# Patient Record
Sex: Male | Born: 1985 | Race: White | Hispanic: No | Marital: Single
Health system: Midwestern US, Academic
[De-identification: ages and names within clinical notes are randomized; demographics above are authoritative.]

## PROBLEM LIST (undated history)

## (undated) DIAGNOSIS — F32A Depression, unspecified: Secondary | ICD-10-CM

## (undated) DIAGNOSIS — F329 Major depressive disorder, single episode, unspecified: Secondary | ICD-10-CM

---

## 2015-01-09 ENCOUNTER — Emergency Department
Admission: EM | Admit: 2015-01-09 | Discharge: 2015-01-10 | Disposition: A | Discharge status: Home / Self Care | Payer: Medicaid Other | Attending: Emergency Medicine | Admitting: Emergency Medicine

## 2015-01-09 ENCOUNTER — Emergency Department (EMERGENCY_DEPARTMENT_HOSPITAL): Payer: Medicaid Other

## 2015-01-09 DIAGNOSIS — S81012A Laceration without foreign body, left knee, initial encounter: Principal | ICD-10-CM | POA: Insufficient documentation

## 2015-01-09 DIAGNOSIS — W311XXA Contact with metalworking machines, initial encounter: Secondary | ICD-10-CM | POA: Insufficient documentation

## 2015-01-09 DIAGNOSIS — Y929 Unspecified place or not applicable: Secondary | ICD-10-CM | POA: Insufficient documentation

## 2015-01-09 DIAGNOSIS — S81012S Laceration without foreign body, left knee, sequela: Secondary | ICD-10-CM | POA: Insufficient documentation

## 2015-01-09 HISTORY — DX: Depression, unspecified: F32.A

## 2015-01-09 HISTORY — DX: Major depressive disorder, single episode, unspecified: F32.9

## 2015-01-09 NOTE — ED Triage Note (Signed)
Patient alert 2 wks ago was using a grinder and accidentally cut his lt knee.   A friend sutured it and gave him antibiotics. Pt states wound opened up now draining states has been packing the wound states not getting better.

## 2015-01-09 NOTE — ED Nursing Note (Signed)
Pt denies fever, wound above left knee is swollen and red.  Pt refused to remove the dressings at this time.  Pt ambulatory with steady gait.

## 2015-01-09 NOTE — ED Initial Note (Signed)
EMERGENCY DEPARTMENT PHYSICIAN NOTE - Shane Hendrix Ciaravino       Date of Service:   01/09/2015  9:21 PM Patient's PCP: No Pcp No Pcp   Note Started: 01/09/2015 22:24 DOB: November 25, 1985             Chief Complaint   Patient presents with    Wound Contaminated       The history provided by the patient.  Interpreter used: No    Shane Hendrix Ebrahimi is a 29yr old male, with a past medical history significant for depression, who presents to the ED with a chief complaint of left knee wound that began 2 weeks ago. Patient sustained wound from angle grinder when it sliced into his left knee. He had the wound sutured and was given Clindamycin, which he took for the next 4-5 days until suture was removed. The next day, patient packed the wound when his left knee started swelling and becoming more painful, making it more difficult to walk. About 3 days ago, patient noticed some drainage from the wound, so began taking Clindamycin again. Denies any gait problems or fevers.   Believes his last tetanus was within last 10 years. Presents today because of poor healing.     Quality: Aching  Location: left knee  Severity: 1/10  Time Course: constant  Progression: unchanged  Duration: 2 weeks  Palliative factors: nothing makes it better.  Provocative factors: nothing makes it worse.  Associated symptoms: Gait problem (now resolved), wound  Pertinent negatives: No current gait problems, no numbness or fevers.     A full history, including pertinent past medical and social history was reviewed.    HISTORY:  1    *Knee laceration, left, sequela     No Known Allergies   Past Medical History:    Depression                                                 History reviewed. No pertinent surgical history.   Social History    Marital status: SINGLE              Spouse name:                       Years of education:                 Number of children:               Occupational History    None on file    Social History Main Topics    Smoking status:  Current Every Day Smoker                                                     Packs/day: 0.00      Years: 0.00           Smokeless status: Not on file                       Alcohol use: No              Drug use: No  Sexual activity: Not on file          Other Topics            Concern    None on file    Social History Narrative    None on file     No family history on file.           Review of Systems   Constitutional: Negative for fever.   Musculoskeletal: Negative for gait problem.   Skin: Positive for wound.       TRIAGE VITAL SIGNS:  Temp: 36.5 C (97.7 F) (01/09/15 1501)  Temp src: Oral (01/09/15 1501)  Pulse: 92 (01/09/15 1501)  BP: 150/87 (01/09/15 1501)  Resp: 16 (01/09/15 1501)  SpO2: 100 % (01/09/15 1501)  Weight: 97.7 kg (215 lb 6.2 oz) (01/09/15 1507)    Physical Exam   Constitutional: He is oriented to person, place, and time. He appears well-developed and well-nourished. No distress.   HENT:   Head: Normocephalic and atraumatic.   Mouth/Throat: Oropharynx is clear and moist. No oropharyngeal exudate.   Eyes: Conjunctivae and EOM are normal. Pupils are equal, round, and reactive to light.   Neck: Normal range of motion. Neck supple.   Cardiovascular: Normal rate, regular rhythm and normal heart sounds.  Exam reveals no gallop and no friction rub.    No murmur heard.  Pulmonary/Chest: Effort normal and breath sounds normal. No respiratory distress. He has no wheezes. He has no rales.   Abdominal: Soft. He exhibits no distension. There is no tenderness. There is no rebound and no guarding.   Musculoskeletal: Normal range of motion. He exhibits tenderness.   LLE: 1.5-2 cm open wound to superior lateral aspect of left knee, extending to muscle belly. No purulence, effusion, erythema, joint line tenderness, or pain with ROM.    Neurological: He is alert and oriented to person, place, and time. No cranial nerve deficit.   Skin: Skin is warm and dry. No rash noted. He is not diaphoretic.    Psychiatric: He has a normal mood and affect. His behavior is normal.   Nursing note and vitals reviewed.      INITIAL ASSESSMENT & PLAN, MEDICAL DECISION MAKING, ED COURSE  Humberto Addo is a 29yr male who presents with a chief complaint of left knee wound.     Differential includes, but is not limited to: non-healing wound, joint capsule injury, foreign body, cellulitis, abscess.    The results of the ED evaluation were notable for the following:    Pertinent imaging results (reviewed and interpreted independently by me):   Radiology reads:   KNEE 4+ VIEWS, LEFT  Soft tissue swelling without acute fracture or dislocation.  No intra-articular air.      Patient Summary: 29 y/o male with nonhealing wound to left knee after injury about 2 weeks ago. Wound was sutured by friend and removed after about 7 days without incident but subsequently developed dehiscence of wound with discharge. He has been packing wound but presents due to poor healing. No joint pain or effusion to suggest septic arthritis.  No purulent discharge from wound and erythema localized to wound edges and no evidence of cellulitis. Given poor healing and location of wound, discussed with ortho, who recommends continuation of packing but does not believe saline arthrogram or delayed secondary closure is indicated. Patient discharged after wound re-packed and dressed.  Follow up and return precautions discussed.       LAST VITAL SIGNS:  Temp: 36.7  C (98.1 F) (01/10/15 0429)  Temp src: Oral (01/10/15 0429)  Pulse: 58 (01/10/15 0429)  BP: (!) 157/95 (01/10/15 0429)  Resp: 16 (01/10/15 0429)  SpO2: 100 % (01/10/15 0429)  Weight: 97.7 kg (215 lb 6.2 oz) (01/09/15 1507)      Clinical Impression: Left knee laceration, sequela       Disposition: Discharge. Follow up with PCP. ED discharge instructions were reviewed and provided. It was discussed that radiology reports are preliminary and the patient will be contacted for any changes in  interpretation.    PATIENT'S GENERAL CONDITION:  Good: Vital signs are stable and within normal limits. Patient is conscious and comfortable. Indicators are excellent.     SCRIBE STATEMENT  I, Suzie Portela, SCRIBE,  am personally taking down the notes in the presence of Dr. Melody Haver, MD.  Electronically signed by - Suzie Portela, SCRIBE, Scribe  01/09/2015  22:24      SCRIBE DISCLAIMER   I, Elenor Legato, personally performed the services described in this documentation, as scribed by the trained medical scribe above in my presence, and it is both accurate and complete.     Electronically signed by: Elenor Legato, Resident      This patient was seen, evaluated, and care plan was developed with the resident.  I agree with the findings and plan as outlined in our combined note. I personally independently visualized the images and tracings as noted above.      Sharene Butters, MD      Electronically signed by: Sharene Butters, MD, Attending Physician

## 2015-01-09 NOTE — ED Progress Note (Signed)
Emergency Department Triage Note     Subjective: Shane Hendrix is a [age]yr old male who presents to the ED with a chief complaint of knee wound.      Objective:    BP 150/87  Pulse 92  Temp 36.5 C (97.7 F) (Oral)  Resp 16  Ht 1.84 m (6' 0.44")  Wt 97.7 kg (215 lb 6.2 oz)  SpO2 100%  BMI 28.86 kg/m2     General: No acute distress.  Lungs: Breathing comfortably  Heart: normal rate      Assessment: Shane CastleDaniel Kovacevic is a 29yr old male with knee wound.      Plan: Further workup will proceed in b/c/d/e or f pod.    Seen and initially evaluated by:  Charlett NoseJulianne Marie Charrisse Masley, MD, Attending Physician, Department of Emergency Medicine       This patient was seen by a physician in triage. Please see the ED Initial Note for full details of this patient's care. This note covers the brief initial evaluation performed to obtain initial studies to expedite the patient's care. It is not intended to be a comprehensive document of the patient's ED visit.    This patient was instructed that this preliminary assessment and any studies obtained do not constitute a full workup of the patient's chief complaint. The patient was instructed to wait in the assigned area after the triage evaluation to be reevaluated by a physician after initial studies are completed.

## 2015-01-10 ENCOUNTER — Encounter: Payer: Self-pay | Admitting: Emergency Medicine

## 2015-01-10 DIAGNOSIS — S81012S Laceration without foreign body, left knee, sequela: Secondary | ICD-10-CM | POA: Insufficient documentation

## 2015-01-10 NOTE — Discharge Instructions (Signed)
Thank you for choosing [facility] for your emergency health care needs. It has been our privilege to take care of you today. Your primary complaints have been evaluated, conditions requiring emergent intervention have been deemed unlikely, and you have been treated for your symptoms.  At this time it is felt to be safe for you to return home.  Please take all medicines that are prescribed to you as directed (see below).  If you have a primary care physician, it is crucial that you follow up with him or her in the time frame recommended as many health conditions that seem self-limited initially may actually worsen over time.  If you do not have a primary care physician, we will outline the various resources available for you to find one.    - Please follow up with your primary care doctor in 2 days.  - It is very important to take all prescriptions as directed.  - For pain, take tylenol or ibuprofen     -  Return to the emergency department immediately if you experience any fevers greater than 100.4 Farenheit, difficulty breathing, worsening pain, nausea, vomiting, or diarrhea that does not go away, or have any other concerning symptoms.       If at any time you feel that your condition is worsening, call your doctor or return to the emergency department for reevaluation.    Please realize that the results of some studies that you had done during your stay with us (such as xrays and cultures) are only preliminarily resulted.  Results of these studies may change as more information becomes available or as the studies are reevaluated by other members of our health care team in the next few days. We will attempt to contact you with any important changes or additions to the studies that were obtained today.  Additionally, some of the studies done during today's visit may not have results available for a few days.  If you were told that a lab was drawn and that results would be available at a future date you  may call [phone] for these results, please wait 3 days before calling.      [facility]    [facility]  [address], [place]      [phone] (will fill [facility] after hours)  [address], [place]   [phone]    [facility]  [address]       [phone]  (until 11pm)

## 2015-01-10 NOTE — ED Nursing Note (Signed)
Patient discharged to home.  Discharge instructions provided; Patient expressed understanding.  No other needs or concerns expressed.  Patient ambulated to discharge area with even and steady gait.

## 2015-01-10 NOTE — ED Nursing Note (Signed)
No distress noted.  Updated Patient with plan of care; Patient expressed understanding.  Patient waiting Ortho consult.

## 2015-01-10 NOTE — ED Nursing Note (Signed)
Resident at bedside to pack wound.

## 2015-01-10 NOTE — ED Nursing Note (Signed)
Ortho at bedside for assessment

## 2016-07-01 ENCOUNTER — Encounter: Admit: 2016-07-01 | Discharge: 2016-07-01 | Payer: BC Managed Care – PPO

## 2016-07-01 ENCOUNTER — Ambulatory Visit: Admit: 2016-07-01 | Discharge: 2016-07-01 | Payer: BC Managed Care – PPO

## 2016-07-01 DIAGNOSIS — T148XXA Other injury of unspecified body region, initial encounter: Principal | ICD-10-CM

## 2016-07-01 DIAGNOSIS — S02609G Fracture of mandible, unspecified, subsequent encounter for fracture with delayed healing: Principal | ICD-10-CM

## 2016-07-01 NOTE — Progress Notes
Date of Service: 07/01/2016    Subjective:             Anthony Bean is a 31 y.o. male.    History of Present Illness  Here for wound in the inner right lower mucosal area s/p ORIF mandible 10/12/2015.  Pt was doing well until a few months ago when he noticed a wound in his mouth.  No drainage and had been following with a dentist, endodontist and then was referred to a facial surgeon Dr. Burgess Estelle.  He returns for out opinion regarding the need to remove the plate and screws.  He denies any new pain,swelling, redness, drainage, foul taste in mouth, fever or chills or feeling ill.     Pt is a smoker 1 ppd and discussed the risks of smoking and wound healing with him.   Pt also has a hx of drug and alcohol addiction and we would be strict on his pain medications while here.     Family and social hx reviewed and noncontributory unless noted above.     Review of Systems   Constitutional: Negative.    HENT: Negative.    Eyes: Negative.    Respiratory: Negative.    Cardiovascular: Negative.    Gastrointestinal: Negative.    Endocrine: Negative.    Genitourinary: Negative.    Musculoskeletal: Negative.    Skin: Negative.    Allergic/Immunologic: Negative.    Neurological: Negative.    Hematological: Negative.    Psychiatric/Behavioral: Negative.          Objective:         ??? GABAPENTIN PO Take  by mouth. Indications: 1200 mg q8h prn   ??? oxycodone(+) (ROXICODONE, OXY-IR) 10 mg tablet Take 1 tablet by mouth every 4 hours as needed Earliest Fill Date: 10/29/15 (Patient taking differently: Take by mouth every 4 hours as needed Indications: 4mg  q8h prn )   ??? venlafaxine XR (EFFEXOR XR) 150 mg capsule Take 1 capsule by mouth daily. Take with food.     Vitals:    07/01/16 1326   BP: (!) 151/93   Pulse: 89   Weight: 90.7 kg (200 lb)   Height: 180.3 cm (71)     Body mass index is 27.89 kg/m???.     Physical Exam   Constitutional: He is oriented to person, place, and time. He appears well-developed and well-nourished.   HENT: Head: Normocephalic and atraumatic.   Eyes: Pupils are equal, round, and reactive to light.   Neck: Normal range of motion.   Cardiovascular: Normal rate.    Pulmonary/Chest: Effort normal.   Abdominal: Soft.   Musculoskeletal: Normal range of motion.   Neurological: He is alert and oriented to person, place, and time.   Skin: Skin is warm and dry.   Psychiatric: He has a normal mood and affect. His behavior is normal. Thought content normal.     Occulusion remains intact  Right lower inner mucosa with open wound at the right lower gum line anterior to tooth #31.  Tooth 30 has been excised.  No drainage or inflamed swelling of the gum.  Exposed bone but no visible exposed plate present.  No palpable fluid collection.   Vital signs reviewed         Assessment and Plan:  31 yo male s/p ORIF right mandible on 10/12/15 now with exposed bone  - Discussed options of observation and oral hygiene and seeing if bone heals vs surgical excision of upper plate and closure.  Risks discussed with be infection, bleeding, delayed wound healing and need for additional procedures and pt did opt for surgery.  - Plan for the OR on 6/07 for debridement and removal of plate with Dr. Caralee Ates    - Advised to refrain from smoking    Pt did bring in imaging panorex and 3d imaging that will be reviewed by Dr. Caralee Ates.  He will also try and obtain his CT scan if possible.

## 2016-07-02 ENCOUNTER — Encounter: Admit: 2016-07-02 | Discharge: 2016-07-02 | Payer: BC Managed Care – PPO

## 2016-07-02 DIAGNOSIS — T148XXA Other injury of unspecified body region, initial encounter: Principal | ICD-10-CM

## 2016-07-02 DIAGNOSIS — S01502D Unspecified open wound of oral cavity, subsequent encounter: Principal | ICD-10-CM

## 2016-07-02 DIAGNOSIS — R69 Illness, unspecified: Principal | ICD-10-CM

## 2016-07-03 ENCOUNTER — Ambulatory Visit: Admit: 2016-07-03 | Discharge: 2016-07-03 | Payer: BC Managed Care – PPO

## 2016-07-03 ENCOUNTER — Encounter: Admit: 2016-07-03 | Discharge: 2016-07-03 | Payer: BC Managed Care – PPO

## 2016-07-03 ENCOUNTER — Ambulatory Visit: Admit: 2016-07-02 | Discharge: 2016-07-02 | Payer: BC Managed Care – PPO

## 2016-07-03 DIAGNOSIS — F17210 Nicotine dependence, cigarettes, uncomplicated: ICD-10-CM

## 2016-07-03 DIAGNOSIS — Z9889 Other specified postprocedural states: ICD-10-CM

## 2016-07-03 DIAGNOSIS — Z8781 Personal history of (healed) traumatic fracture: ICD-10-CM

## 2016-07-03 DIAGNOSIS — T8489XA Other specified complication of internal orthopedic prosthetic devices, implants and grafts, initial encounter: ICD-10-CM

## 2016-07-03 DIAGNOSIS — M272 Inflammatory conditions of jaws: Principal | ICD-10-CM

## 2016-07-03 DIAGNOSIS — S01502A Unspecified open wound of oral cavity, initial encounter: ICD-10-CM

## 2016-07-03 MED ORDER — OXYCODONE 5 MG PO TAB
5 mg | ORAL_TABLET | ORAL | 0 refills | 6.00000 days | Status: AC | PRN
Start: 2016-07-03 — End: 2016-08-15

## 2016-07-03 MED ORDER — PROMETHAZINE 25 MG/ML IJ SOLN
6.25 mg | INTRAVENOUS | 0 refills | Status: DC | PRN
Start: 2016-07-03 — End: 2016-07-03

## 2016-07-03 MED ORDER — DEXTRAN 70-HYPROMELLOSE (PF) 0.1-0.3 % OP DPET
0 refills | Status: DC
Start: 2016-07-03 — End: 2016-07-03
  Administered 2016-07-03: 15:00:00 2 [drp] via OPHTHALMIC

## 2016-07-03 MED ORDER — FENTANYL CITRATE (PF) 50 MCG/ML IJ SOLN
0 refills | Status: DC
Start: 2016-07-03 — End: 2016-07-03
  Administered 2016-07-03: 15:00:00 100 ug via INTRAVENOUS
  Administered 2016-07-03 (×2): 50 ug via INTRAVENOUS

## 2016-07-03 MED ORDER — KETAMINE 10 MG/ML IJ SOLN
0 refills | Status: DC
Start: 2016-07-03 — End: 2016-07-03
  Administered 2016-07-03: 16:00:00 20 mg via INTRAVENOUS

## 2016-07-03 MED ORDER — DEXMEDETOMIDINE# 4MCG/ML IV SOLN
0 refills | Status: DC
Start: 2016-07-03 — End: 2016-07-03
  Administered 2016-07-03: 16:00:00 4 ug via INTRAVENOUS
  Administered 2016-07-03: 16:00:00 8 ug via INTRAVENOUS
  Administered 2016-07-03 (×2): 4 ug via INTRAVENOUS

## 2016-07-03 MED ORDER — HYDROMORPHONE (PF) 2 MG/ML IJ SYRG
.5 mg | INTRAVENOUS | 0 refills | Status: DC | PRN
Start: 2016-07-03 — End: 2016-07-03
  Administered 2016-07-03: 17:00:00 0.5 mg via INTRAVENOUS

## 2016-07-03 MED ORDER — TRAMADOL 50 MG PO TAB
50 mg | ORAL_TABLET | ORAL | 0 refills | Status: AC | PRN
Start: 2016-07-03 — End: 2016-08-15

## 2016-07-03 MED ORDER — BUPIVACAINE-EPINEPHRINE 0.25 %-1:200,000 IJ SOLN
0 refills | Status: DC
Start: 2016-07-03 — End: 2016-07-03
  Administered 2016-07-03: 16:00:00 10 mL via INTRAMUSCULAR

## 2016-07-03 MED ORDER — MIDAZOLAM 1 MG/ML IJ SOLN
INTRAVENOUS | 0 refills | Status: DC
Start: 2016-07-03 — End: 2016-07-03
  Administered 2016-07-03: 15:00:00 2 mg via INTRAVENOUS

## 2016-07-03 MED ORDER — PROPOFOL INJ 10 MG/ML IV VIAL
0 refills | Status: DC
Start: 2016-07-03 — End: 2016-07-03
  Administered 2016-07-03: 15:00:00 200 mg via INTRAVENOUS

## 2016-07-03 MED ORDER — ONDANSETRON HCL (PF) 4 MG/2 ML IJ SOLN
INTRAVENOUS | 0 refills | Status: DC
Start: 2016-07-03 — End: 2016-07-03
  Administered 2016-07-03: 16:00:00 4 mg via INTRAVENOUS

## 2016-07-03 MED ORDER — CEFAZOLIN 1 GRAM IJ SOLR
0 refills | Status: DC
Start: 2016-07-03 — End: 2016-07-03
  Administered 2016-07-03: 16:00:00 2 g via INTRAVENOUS

## 2016-07-03 MED ORDER — LIDOCAINE (PF) 10 MG/ML (1 %) IJ SOLN
.1-2 mL | INTRAMUSCULAR | 0 refills | Status: DC | PRN
Start: 2016-07-03 — End: 2016-07-03

## 2016-07-03 MED ORDER — ROCURONIUM 10 MG/ML IV SOLN
INTRAVENOUS | 0 refills | Status: DC
Start: 2016-07-03 — End: 2016-07-03
  Administered 2016-07-03: 15:00:00 50 mg via INTRAVENOUS

## 2016-07-03 MED ORDER — LIDOCAINE (PF) 200 MG/10 ML (2 %) IJ SYRG
0 refills | Status: DC
Start: 2016-07-03 — End: 2016-07-03
  Administered 2016-07-03: 15:00:00 80 mg via INTRAVENOUS

## 2016-07-03 MED ORDER — CHLORHEXIDINE GLUCONATE 0.12 % MM MWSH
15 mL | Freq: Every day | ORAL | 1 refills | 23.00000 days | Status: AC
Start: 2016-07-03 — End: 2016-08-15

## 2016-07-03 MED ORDER — BALANCED SALT SOLN NO.2 IRRIG. IO SOLN
0 refills | Status: DC
Start: 2016-07-03 — End: 2016-07-03
  Administered 2016-07-03: 16:00:00 15 mL via OPHTHALMIC

## 2016-07-03 MED ORDER — FENTANYL CITRATE (PF) 50 MCG/ML IJ SOLN
50 ug | INTRAVENOUS | 0 refills | Status: DC | PRN
Start: 2016-07-03 — End: 2016-07-03
  Administered 2016-07-03: 17:00:00 50 ug via INTRAVENOUS

## 2016-07-03 MED ORDER — DIPHENHYDRAMINE HCL 50 MG/ML IJ SOLN
12.5 mg | Freq: Once | INTRAVENOUS | 0 refills | Status: DC | PRN
Start: 2016-07-03 — End: 2016-07-03

## 2016-07-03 MED ORDER — HALOPERIDOL LACTATE 5 MG/ML IJ SOLN
.5 mg | Freq: Once | INTRAVENOUS | 0 refills | Status: DC | PRN
Start: 2016-07-03 — End: 2016-07-03

## 2016-07-03 MED ORDER — SUGAMMADEX 100 MG/ML IV SOLN
INTRAVENOUS | 0 refills | Status: DC
Start: 2016-07-03 — End: 2016-07-03
  Administered 2016-07-03: 16:00:00 181 mg via INTRAVENOUS

## 2016-07-03 MED ORDER — ACETAMINOPHEN 1,000 MG/100 ML (10 MG/ML) IV SOLN
0 refills | Status: DC
Start: 2016-07-03 — End: 2016-07-03
  Administered 2016-07-03: 16:00:00 1000 mg via INTRAVENOUS

## 2016-07-03 MED ORDER — LACTATED RINGERS IV SOLP
INTRAVENOUS | 0 refills | Status: DC
Start: 2016-07-03 — End: 2016-07-03
  Administered 2016-07-03: 15:00:00 0 mL via INTRAVENOUS

## 2016-07-03 MED ORDER — AMOXICILLIN-POT CLAVULANATE 875-125 MG PO TAB
1 | ORAL_TABLET | Freq: Two times a day (BID) | ORAL | 0 refills | 7.00000 days | Status: AC
Start: 2016-07-03 — End: ?

## 2016-07-03 MED ORDER — DEXAMETHASONE SODIUM PHOSPHATE 4 MG/ML IJ SOLN
INTRAVENOUS | 0 refills | Status: DC
Start: 2016-07-03 — End: 2016-07-03
  Administered 2016-07-03: 16:00:00 4 mg via INTRAVENOUS

## 2016-07-03 MED ADMIN — FENTANYL CITRATE (PF) 50 MCG/ML IJ SOLN [3037]: 50 ug | INTRAVENOUS | @ 17:00:00 | Stop: 2016-07-03 | NDC 00409909412

## 2016-07-03 MED ADMIN — LIDOCAINE (PF) 10 MG/ML (1 %) IJ SOLN [95838]: 0.1 mL | INTRADERMAL | @ 15:00:00 | Stop: 2016-07-03 | NDC 63323049227

## 2016-07-03 NOTE — Other
Brief Operative Note    Name: Anthony ChristmasDaniel Ivar Dais is a 31 y.o. male     DOB: May 01, 1985             MRN#: [id number]  DATE OF OPERATION: 07/03/2016    Date:  07/03/2016        Preoperative Dx:   Open wound of mouth, subsequent encounter [S01.502D]    Post-op Diagnosis      * Open wound of mouth, subsequent encounter [S01.502D]    Procedure(s):  DEEP REMOVAL OF HARDWARE AND MANDIBLE DEBRIDEMENT     Anesthesia Type: General    Surgeon(s) and Role:     Purvis Sheffield* Miller, Trent Gabler J, MD - Resident - Assisting     Orbie Pyo* Andrews, Brian T, MD - Primary      Findings:  See operative note for full findings. Superior mandibular plate and screws removed, right side.    Estimated Blood Loss: 20cc     Specimen(s) Removed/Disposition: None  Complications:  None    Implants: None    Drains: None    Disposition:  Fast Track    Purvis SheffieldHannah J Miller, MD  Pager [phone]

## 2016-07-03 NOTE — Interval H&P Note
History and Physical Update Note    Allergies:  Patient has no known allergies.    Lab/Radiology/Other Diagnostic Tests:  24-hour labs:  No results found for this visit on 07/03/16 (from the past 24 hour(s)).  Point of Care Testing:  (Last 24 hours):         I have examined the patient, and there are no significant changes in their condition, from the previous H&P performed on 07/01/16..    Chase Arnall J Miller, MD  Pager [phone]    --------------------------------------------------------------------------------------------------------------------------------------------
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
## 2016-07-03 NOTE — Discharge Instructions
VSS , Afebrile Pain and Nausea under control (see MAR) DC instructions completed with Patient and family. All questions answered. Pt on wheelchair to [facility] lobby with all their belongings.

## 2016-07-03 NOTE — Anesthesia Post-Procedure Evaluation
Post-Anesthesia Evaluation    Name: Anthony Bean      MRN: [id number]     DOB: 03/25/1985     Age: 31 y.o.     Sex: male   __________________________________________________________________________     Procedure Date: 07/03/2016  Procedure: Procedure(s) with comments:  DEEP REMOVAL OF HARDWARE AND MANDIIBLE DEBRIDEMENt - CASE LENGTH 2 HOURS      Surgeon: Surgeon(s):  Orbie PyoAndrews, Brian T, MD  Purvis SheffieldMiller, Hannah J, MD    Post-Anesthesia Vitals  BP: (128-152)/(84-89)   Pulse:  [58-69]   Respirations:  [10 PER MINUTE-12 PER MINUTE]   SpO2:  [89 %-97 %]   O2 Delivery: None (Room Air) (06/07 1230)  SpO2 Pulse:  [57-71]        Post Anesthesia Evaluation Note    Evaluation location: pre/post  Patient participation: recovered; patient participated in evaluation  Level of consciousness: alert  Pain management: adequate    Hydration: normovolemia  Temperature: 36.0C - 38.4C  Airway patency: adequate    Perioperative Events  Perioperative events:  no       Post-op nausea and vomiting: no PONV    Postoperative Status  Cardiovascular status: hemodynamically stable  Respiratory status: spontaneous ventilation  Follow-up needed: none      Perioperative Events  Perioperative Event: No  Emergency Case Activation: No

## 2016-07-12 ENCOUNTER — Emergency Department
Admit: 2016-07-12 | Discharge: 2016-07-12 | Disposition: A | Discharge status: Home / Self Care | Payer: BC Managed Care – PPO

## 2016-07-12 DIAGNOSIS — Z98818 Other dental procedure status: ICD-10-CM

## 2016-07-12 DIAGNOSIS — S02609D Fracture of mandible, unspecified, subsequent encounter for fracture with routine healing: Principal | ICD-10-CM
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
## 2016-07-12 NOTE — ED Notes
Pt verbalized understanding of d/c instructions and f/u. Pt is A&Ox3, breathing NL, pt in NAD. All pt's questions answered at this time. Pt ambultaed out of ED.
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
## 2016-07-18 ENCOUNTER — Ambulatory Visit: Admit: 2016-07-18 | Discharge: 2016-07-18 | Payer: BC Managed Care – PPO

## 2016-07-18 DIAGNOSIS — S02601D Fracture of unspecified part of body of right mandible, subsequent encounter for fracture with routine healing: Principal | ICD-10-CM

## 2016-08-13 NOTE — Progress Notes
Small amount of bone showing at the tooth extraction site  reocmmend he brush vigorously as may mucosalize  If not will ronger in office

## 2016-08-15 ENCOUNTER — Ambulatory Visit: Admit: 2016-08-15 | Discharge: 2016-08-15 | Payer: BC Managed Care – PPO

## 2016-08-15 DIAGNOSIS — S02601D Fracture of unspecified part of body of right mandible, subsequent encounter for fracture with routine healing: Principal | ICD-10-CM

## 2016-08-19 NOTE — Progress Notes
Anthony Bean  Still with small piece of bone at left molar alveolor ridge  No new problem    I removed the small bone spicule with rongeurs in th eoffic    Good healthy tissue remaining    Will continue to follow

## 2016-09-05 ENCOUNTER — Ambulatory Visit: Admit: 2016-09-05 | Discharge: 2016-09-05 | Payer: BC Managed Care – PPO

## 2016-09-05 ENCOUNTER — Encounter: Admit: 2016-09-05 | Discharge: 2016-09-05 | Payer: BC Managed Care – PPO

## 2016-09-05 DIAGNOSIS — S02601D Fracture of unspecified part of body of right mandible, subsequent encounter for fracture with routine healing: Principal | ICD-10-CM

## 2016-09-05 DIAGNOSIS — T148XXA Other injury of unspecified body region, initial encounter: Principal | ICD-10-CM

## 2016-09-09 NOTE — Progress Notes
All healed  No bone expsoure  Teeth stable  Dental work ad lib

## 2016-11-11 ENCOUNTER — Encounter: Admit: 2016-11-11 | Discharge: 2016-11-11 | Payer: BC Managed Care – PPO

## 2016-11-11 DIAGNOSIS — T148XXA Other injury of unspecified body region, initial encounter: Principal | ICD-10-CM

## 2017-04-30 ENCOUNTER — Encounter: Admit: 2017-04-30 | Discharge: 2017-04-30 | Payer: BC Managed Care – PPO

## 2017-04-30 DIAGNOSIS — T148XXA Other injury of unspecified body region, initial encounter: Principal | ICD-10-CM

## 2017-12-10 ENCOUNTER — Encounter: Admit: 2017-12-10 | Discharge: 2017-12-10 | Payer: BC Managed Care – PPO

## 2017-12-10 ENCOUNTER — Ambulatory Visit: Admit: 2017-12-10 | Discharge: 2017-12-11 | Payer: BC Managed Care – PPO

## 2017-12-10 DIAGNOSIS — T1490XA Injury, unspecified, initial encounter: ICD-10-CM

## 2017-12-10 DIAGNOSIS — T148XXA Other injury of unspecified body region, initial encounter: Principal | ICD-10-CM

## 2017-12-11 ENCOUNTER — Encounter: Admit: 2017-12-11 | Discharge: 2017-12-11 | Payer: BC Managed Care – PPO

## 2017-12-11 DIAGNOSIS — M272 Inflammatory conditions of jaws: Principal | ICD-10-CM

## 2017-12-11 DIAGNOSIS — T148XXA Other injury of unspecified body region, initial encounter: Principal | ICD-10-CM

## 2017-12-17 ENCOUNTER — Ambulatory Visit: Admit: 2017-12-17 | Discharge: 2017-12-17 | Payer: BC Managed Care – PPO

## 2017-12-17 ENCOUNTER — Inpatient Hospital Stay: Admit: 2017-12-17 | Discharge: 2017-12-17 | Payer: BC Managed Care – PPO

## 2017-12-17 ENCOUNTER — Encounter: Admit: 2017-12-17 | Discharge: 2017-12-17 | Payer: BC Managed Care – PPO

## 2017-12-17 DIAGNOSIS — M272 Inflammatory conditions of jaws: Principal | ICD-10-CM

## 2017-12-17 DIAGNOSIS — S02601D Fracture of unspecified part of body of right mandible, subsequent encounter for fracture with routine healing: ICD-10-CM

## 2017-12-17 DIAGNOSIS — T148XXA Other injury of unspecified body region, initial encounter: Principal | ICD-10-CM

## 2017-12-18 ENCOUNTER — Encounter: Admit: 2017-12-18 | Discharge: 2017-12-18 | Payer: BC Managed Care – PPO

## 2017-12-18 MED ORDER — KETOROLAC 15 MG/ML IJ SOLN
15 mg | INTRAVENOUS | 0 refills | Status: DC
Start: 2017-12-18 — End: 2017-12-20
  Administered 2017-12-19 – 2017-12-20 (×6): 15 mg via INTRAVENOUS

## 2017-12-18 MED ORDER — ACETAMINOPHEN 500 MG PO TAB
1000 mg | Freq: Once | ORAL | 0 refills | Status: CP
Start: 2017-12-18 — End: ?
  Administered 2017-12-18: 22:00:00 1000 mg via ORAL

## 2017-12-18 MED ORDER — GABAPENTIN 100 MG PO CAP
100 mg | ORAL | 0 refills | Status: DC
Start: 2017-12-18 — End: 2017-12-20
  Administered 2017-12-19 – 2017-12-20 (×5): 100 mg via ORAL

## 2017-12-18 MED ORDER — BUPROPION XL 300 MG PO TB24
300 mg | Freq: Every morning | ORAL | 0 refills | Status: DC
Start: 2017-12-18 — End: 2017-12-20
  Administered 2017-12-19 – 2017-12-20 (×2): 300 mg via ORAL

## 2017-12-18 MED ORDER — BUSPIRONE 10 MG PO TAB
10 mg | Freq: Three times a day (TID) | ORAL | 0 refills | Status: DC
Start: 2017-12-18 — End: 2017-12-20
  Administered 2017-12-19 – 2017-12-20 (×5): 10 mg via ORAL

## 2017-12-18 MED ORDER — PROPOFOL INJ 10 MG/ML IV VIAL
0 refills | Status: DC
Start: 2017-12-18 — End: 2017-12-19
  Administered 2017-12-18: 200 mg via INTRAVENOUS

## 2017-12-18 MED ORDER — ONDANSETRON HCL 4 MG PO TAB
4 mg | ORAL | 0 refills | Status: DC | PRN
Start: 2017-12-18 — End: 2017-12-20

## 2017-12-18 MED ORDER — OXYCODONE 5 MG PO TAB
5-10 mg | Freq: Once | ORAL | 0 refills | Status: DC | PRN
Start: 2017-12-18 — End: 2017-12-19

## 2017-12-18 MED ORDER — CLONIDINE HCL 0.1 MG PO TAB
.1 mg | Freq: Three times a day (TID) | ORAL | 0 refills | Status: DC
Start: 2017-12-18 — End: 2017-12-20
  Administered 2017-12-19 – 2017-12-20 (×5): 0.1 mg via ORAL

## 2017-12-18 MED ORDER — FENTANYL CITRATE (PF) 50 MCG/ML IJ SOLN
0 refills | Status: DC
Start: 2017-12-18 — End: 2017-12-19
  Administered 2017-12-18: 100 ug via INTRAVENOUS
  Administered 2017-12-19 (×3): 50 ug via INTRAVENOUS

## 2017-12-18 MED ORDER — HYDROMORPHONE (PF) 2 MG/ML IJ SYRG
0 refills | Status: DC
Start: 2017-12-18 — End: 2017-12-19
  Administered 2017-12-19 (×2): .5 mg via INTRAVENOUS

## 2017-12-18 MED ORDER — FENTANYL CITRATE (PF) 50 MCG/ML IJ SOLN
25-50 ug | INTRAVENOUS | 0 refills | Status: DC | PRN
Start: 2017-12-18 — End: 2017-12-19
  Administered 2017-12-19: 04:00:00 50 ug via INTRAVENOUS
  Administered 2017-12-19 (×4): 25 ug via INTRAVENOUS
  Administered 2017-12-19: 04:00:00 50 ug via INTRAVENOUS

## 2017-12-18 MED ORDER — LIDOCAINE (PF) 10 MG/ML (1 %) IJ SOLN
.1-2 mL | INTRAMUSCULAR | 0 refills | Status: DC | PRN
Start: 2017-12-18 — End: 2017-12-19

## 2017-12-18 MED ORDER — CEFAZOLIN 1 GRAM IJ SOLR
0 refills | Status: DC
Start: 2017-12-18 — End: 2017-12-19
  Administered 2017-12-19: 2 g via INTRAVENOUS

## 2017-12-18 MED ORDER — AMPICILLIN/SULBACTAM 1.5G/100ML NS IVPB (MB+)
1.5 g | INTRAVENOUS | 0 refills | Status: DC
Start: 2017-12-18 — End: 2017-12-19
  Administered 2017-12-19 (×6): 1.5 g via INTRAVENOUS

## 2017-12-18 MED ORDER — LACTATED RINGERS IV SOLP
1000 mL | INTRAVENOUS | 0 refills | Status: DC
Start: 2017-12-18 — End: 2017-12-19
  Administered 2017-12-19: 02:00:00 1000.000 mL via INTRAVENOUS

## 2017-12-18 MED ORDER — ONDANSETRON HCL (PF) 4 MG/2 ML IJ SOLN
INTRAVENOUS | 0 refills | Status: DC
Start: 2017-12-18 — End: 2017-12-19
  Administered 2017-12-19: 02:00:00 4 mg via INTRAVENOUS

## 2017-12-18 MED ORDER — VENLAFAXINE 150 MG PO CP24
150 mg | Freq: Every day | ORAL | 0 refills | Status: DC
Start: 2017-12-18 — End: 2017-12-20
  Administered 2017-12-19 – 2017-12-20 (×2): 150 mg via ORAL

## 2017-12-18 MED ORDER — ACETAMINOPHEN 500 MG PO TAB
1000 mg | ORAL | 0 refills | Status: DC
Start: 2017-12-18 — End: 2017-12-20
  Administered 2017-12-19 – 2017-12-20 (×5): 1000 mg via ORAL

## 2017-12-18 MED ORDER — HYDROMORPHONE (PF) 2 MG/ML IJ SYRG
.5-1 mg | INTRAVENOUS | 0 refills | Status: DC | PRN
Start: 2017-12-18 — End: 2017-12-19
  Administered 2017-12-19: 05:00:00 0.5 mg via INTRAVENOUS

## 2017-12-18 MED ORDER — LABETALOL 5 MG/ML IV SYRG
10 mg | Freq: Once | INTRAVENOUS | 0 refills | Status: CP
Start: 2017-12-18 — End: ?
  Administered 2017-12-19: 04:00:00 10 mg via INTRAVENOUS

## 2017-12-18 MED ORDER — LIDOCAINE (PF) 200 MG/10 ML (2 %) IJ SYRG
0 refills | Status: DC
Start: 2017-12-18 — End: 2017-12-19

## 2017-12-18 MED ORDER — OXYCODONE 5 MG/5 ML PO SOLN
5-10 mg | Freq: Once | ORAL | 0 refills | Status: CP
Start: 2017-12-18 — End: ?
  Administered 2017-12-19: 04:00:00 10 mg via ORAL

## 2017-12-18 MED ORDER — DEXTRAN 70-HYPROMELLOSE (PF) 0.1-0.3 % OP DPET
0 refills | Status: DC
Start: 2017-12-18 — End: 2017-12-19
  Administered 2017-12-19: 2 [drp] via OPHTHALMIC

## 2017-12-18 MED ORDER — SENNOSIDES 8.6 MG PO TAB
1 | Freq: Every day | ORAL | 0 refills | Status: DC | PRN
Start: 2017-12-18 — End: 2017-12-20

## 2017-12-18 MED ORDER — LIDOCAINE-EPINEPHRINE (PF) 1 %-1:200,000 IJ SOLN
0 refills | Status: DC
Start: 2017-12-18 — End: 2017-12-19
  Administered 2017-12-19: 4.5 mL via INTRAMUSCULAR

## 2017-12-18 MED ORDER — DEXAMETHASONE SODIUM PHOSPHATE 10 MG/ML IJ SOLN
8 mg | INTRAVENOUS | 0 refills | Status: CN
Start: 2017-12-18 — End: ?

## 2017-12-18 MED ORDER — LIDOCAINE (PF) 200 MG/10 ML (2 %) IJ SYRG
0 refills | Status: DC
Start: 2017-12-18 — End: 2017-12-19
  Administered 2017-12-18: 100 mg via INTRAVENOUS

## 2017-12-18 MED ORDER — DEXAMETHASONE SODIUM PHOSPHATE 4 MG/ML IJ SOLN
INTRAVENOUS | 0 refills | Status: DC
Start: 2017-12-18 — End: 2017-12-19
  Administered 2017-12-19: 4 mg via INTRAVENOUS

## 2017-12-18 MED ORDER — ONDANSETRON HCL (PF) 4 MG/2 ML IJ SOLN
4 mg | INTRAVENOUS | 0 refills | Status: DC | PRN
Start: 2017-12-18 — End: 2017-12-20

## 2017-12-18 MED ORDER — MIDAZOLAM 1 MG/ML IJ SOLN
INTRAVENOUS | 0 refills | Status: DC
Start: 2017-12-18 — End: 2017-12-19
  Administered 2017-12-18 (×2): 2 mg via INTRAVENOUS

## 2017-12-18 MED ORDER — OXYCODONE 5 MG PO TAB
5-10 mg | ORAL | 0 refills | Status: DC | PRN
Start: 2017-12-18 — End: 2017-12-20
  Administered 2017-12-19 – 2017-12-20 (×6): 10 mg via ORAL

## 2017-12-18 MED ORDER — CYCLOBENZAPRINE 10 MG PO TAB
10 mg | Freq: Three times a day (TID) | ORAL | 0 refills | Status: DC
Start: 2017-12-18 — End: 2017-12-20
  Administered 2017-12-19 – 2017-12-20 (×5): 10 mg via ORAL

## 2017-12-18 MED ORDER — BUPIVACAINE-EPINEPHRINE 0.25 %-1:200,000 IJ SOLN
0 refills | Status: DC
Start: 2017-12-18 — End: 2017-12-19
  Administered 2017-12-19: 4.5 mL via INTRAMUSCULAR

## 2017-12-18 MED ORDER — TRAZODONE 100 MG PO TAB
100 mg | Freq: Two times a day (BID) | ORAL | 0 refills | Status: DC | PRN
Start: 2017-12-18 — End: 2017-12-20
  Administered 2017-12-19 – 2017-12-20 (×2): 100 mg via ORAL

## 2017-12-18 MED ORDER — PHENYLEPHRINE HCL 1 % NA SPRY
0 refills | Status: DC
Start: 2017-12-18 — End: 2017-12-19
  Administered 2017-12-18: 2 via NASAL

## 2017-12-18 MED ORDER — HALOPERIDOL LACTATE 5 MG/ML IJ SOLN
1 mg | Freq: Once | INTRAVENOUS | 0 refills | Status: DC | PRN
Start: 2017-12-18 — End: 2017-12-19

## 2017-12-18 MED ORDER — MORPHINE 4 MG/ML IV CRTG
1-2 mg | INTRAVENOUS | 0 refills | Status: DC | PRN
Start: 2017-12-18 — End: 2017-12-19
  Administered 2017-12-19 (×2): 2 mg via INTRAVENOUS

## 2017-12-18 MED ORDER — DEXMEDETOMIDINE# 4MCG/ML IV SOLN
0 refills | Status: DC
Start: 2017-12-18 — End: 2017-12-19
  Administered 2017-12-19: 01:00:00 4 ug via INTRAVENOUS
  Administered 2017-12-19: 02:00:00 8 ug via INTRAVENOUS
  Administered 2017-12-19: 02:00:00 4 ug via INTRAVENOUS

## 2017-12-18 MED ORDER — ROCURONIUM 10 MG/ML IV SOLN
INTRAVENOUS | 0 refills | Status: DC
Start: 2017-12-18 — End: 2017-12-19
  Administered 2017-12-18: 50 mg via INTRAVENOUS
  Administered 2017-12-19: 01:00:00 10 mg via INTRAVENOUS

## 2017-12-18 MED ADMIN — LACTATED RINGERS IV SOLP [4318]: 1000 mL | INTRAVENOUS | @ 22:00:00 | Stop: 2017-12-18 | NDC 00338011704

## 2017-12-19 ENCOUNTER — Encounter: Admit: 2017-12-19 | Discharge: 2017-12-19 | Payer: BC Managed Care – PPO

## 2017-12-19 LAB — COMPREHENSIVE METABOLIC PANEL
Lab: 0.3 mg/dL (ref 0.3–1.2)
Lab: 0.6 mg/dL (ref 0.4–1.24)
Lab: 10 10*3/uL — ABNORMAL HIGH (ref 3–12)
Lab: 102 MMOL/L — ABNORMAL LOW (ref 98–110)
Lab: 117 mg/dL — ABNORMAL HIGH (ref 70–100)
Lab: 137 MMOL/L — ABNORMAL LOW (ref 137–147)
Lab: 25 MMOL/L (ref 21–30)
Lab: 34 U/L (ref 7–40)
Lab: 4.1 g/dL — ABNORMAL HIGH (ref 3.5–5.0)
Lab: 4.5 MMOL/L — ABNORMAL LOW (ref 3.5–5.1)
Lab: 41 U/L — ABNORMAL LOW (ref 25–110)
Lab: 54 U/L (ref 7–56)
Lab: 6.3 g/dL (ref 6.0–8.0)
Lab: 8.1 mg/dL — ABNORMAL LOW (ref 8.5–10.6)
Lab: 9 mg/dL (ref 7–25)
Lab: >60 mL/min (ref >60)
Lab: >60 mL/min — ABNORMAL LOW (ref >60)

## 2017-12-19 LAB — GRAM STAIN

## 2017-12-19 LAB — CBC AND DIFF
Lab: 0 10*3/uL (ref 0–0.45)
Lab: 0.1 10*3/uL (ref 0–0.20)
Lab: 12 10*3/uL — ABNORMAL HIGH (ref 4.5–11.0)

## 2017-12-19 MED ORDER — AMPICILLIN/SULBACTAM 3G/100ML NS IVPB (MB+)
3 g | INTRAVENOUS | 0 refills | Status: DC
Start: 2017-12-19 — End: 2017-12-20
  Administered 2017-12-19 – 2017-12-20 (×6): 3 g via INTRAVENOUS

## 2017-12-19 MED ORDER — CHLORHEXIDINE GLUCONATE 0.12 % MM MWSH
15 mL | Freq: Three times a day (TID) | ORAL | 0 refills | Status: DC
Start: 2017-12-19 — End: 2017-12-20
  Administered 2017-12-19 – 2017-12-20 (×3): 15 mL via ORAL

## 2017-12-20 ENCOUNTER — Encounter: Admit: 2017-12-18 | Discharge: 2017-12-20 | Payer: BC Managed Care – PPO

## 2017-12-20 ENCOUNTER — Encounter: Admit: 2017-12-20 | Discharge: 2017-12-20 | Payer: BC Managed Care – PPO

## 2017-12-20 ENCOUNTER — Inpatient Hospital Stay: Admit: 2017-12-18 | Discharge: 2017-12-18 | Payer: BC Managed Care – PPO

## 2017-12-20 DIAGNOSIS — M869 Osteomyelitis, unspecified: ICD-10-CM

## 2017-12-20 DIAGNOSIS — F329 Major depressive disorder, single episode, unspecified: ICD-10-CM

## 2017-12-20 DIAGNOSIS — Z9889 Other specified postprocedural states: ICD-10-CM

## 2017-12-20 DIAGNOSIS — T1490XA Injury, unspecified, initial encounter: ICD-10-CM

## 2017-12-20 DIAGNOSIS — F17210 Nicotine dependence, cigarettes, uncomplicated: ICD-10-CM

## 2017-12-20 DIAGNOSIS — F1010 Alcohol abuse, uncomplicated: ICD-10-CM

## 2017-12-20 DIAGNOSIS — L905 Scar conditions and fibrosis of skin: ICD-10-CM

## 2017-12-20 DIAGNOSIS — M8588 Other specified disorders of bone density and structure, other site: ICD-10-CM

## 2017-12-20 DIAGNOSIS — T8489XA Other specified complication of internal orthopedic prosthetic devices, implants and grafts, initial encounter: Principal | ICD-10-CM

## 2017-12-20 DIAGNOSIS — T148XXA Other injury of unspecified body region, initial encounter: Principal | ICD-10-CM

## 2017-12-20 MED ORDER — AMOXICILLIN-POT CLAVULANATE 875-125 MG PO TAB
1 | ORAL_TABLET | Freq: Two times a day (BID) | ORAL | 0 refills | 7.00000 days | Status: AC
Start: 2017-12-20 — End: 2017-12-29
  Filled 2017-12-20 (×2): qty 28, 14d supply, fill #1

## 2017-12-20 MED ORDER — ACETAMINOPHEN 500 MG PO TAB
1000 mg | ORAL_TABLET | ORAL | 0 refills | Status: AC
Start: 2017-12-20 — End: ?

## 2017-12-20 MED ORDER — SENNOSIDES 8.6 MG PO TAB
1 | ORAL_TABLET | Freq: Every day | ORAL | 0 refills | Status: AC | PRN
Start: 2017-12-20 — End: 2017-12-31

## 2017-12-20 MED ORDER — CHLORHEXIDINE GLUCONATE 0.12 % MM MWSH
15 mL | Freq: Three times a day (TID) | ORAL | 0 refills | 23.00000 days | Status: AC
Start: 2017-12-20 — End: ?

## 2017-12-20 MED ORDER — LEVOFLOXACIN 750 MG PO TAB
750 mg | ORAL_TABLET | Freq: Every day | ORAL | 0 refills | 7.00000 days | Status: AC
Start: 2017-12-20 — End: 2017-12-29
  Filled 2017-12-20 (×2): qty 14, 14d supply, fill #1

## 2017-12-20 MED ORDER — OXYCODONE 5 MG PO TAB
5 mg | ORAL_TABLET | ORAL | 0 refills | 6.00000 days | Status: AC | PRN
Start: 2017-12-20 — End: 2017-12-31
  Filled 2017-12-20 (×2): qty 15, 4d supply, fill #1

## 2017-12-23 LAB — CULTURE-ANAEROBIC

## 2017-12-23 LAB — CULTURE-WOUND/TISSUE/FLUID(AEROBIC ONLY)W/SENSITIVITY

## 2017-12-29 ENCOUNTER — Encounter: Admit: 2017-12-29 | Discharge: 2017-12-29 | Payer: BC Managed Care – PPO

## 2017-12-29 DIAGNOSIS — T148XXA Other injury of unspecified body region, initial encounter: Principal | ICD-10-CM

## 2017-12-29 MED ORDER — AMOXICILLIN-POT CLAVULANATE 875-125 MG PO TAB
1 | ORAL_TABLET | Freq: Two times a day (BID) | ORAL | 0 refills | 7.00000 days | Status: AC
Start: 2017-12-29 — End: ?

## 2017-12-29 MED ORDER — LEVOFLOXACIN 750 MG PO TAB
750 mg | ORAL_TABLET | Freq: Every day | ORAL | 0 refills | 7.00000 days | Status: AC
Start: 2017-12-29 — End: ?

## 2017-12-30 ENCOUNTER — Ambulatory Visit: Admit: 2017-12-29 | Discharge: 2017-12-30 | Payer: BC Managed Care – PPO

## 2017-12-30 DIAGNOSIS — S01502D Unspecified open wound of oral cavity, subsequent encounter: ICD-10-CM

## 2017-12-30 DIAGNOSIS — S02601D Fracture of unspecified part of body of right mandible, subsequent encounter for fracture with routine healing: Principal | ICD-10-CM

## 2017-12-31 ENCOUNTER — Encounter: Admit: 2017-12-31 | Discharge: 2017-12-31 | Payer: BC Managed Care – PPO

## 2017-12-31 ENCOUNTER — Ambulatory Visit: Admit: 2017-12-31 | Discharge: 2018-01-01 | Payer: BC Managed Care – PPO

## 2017-12-31 DIAGNOSIS — T148XXA Other injury of unspecified body region, initial encounter: Principal | ICD-10-CM

## 2017-12-31 MED ORDER — CHLORHEXIDINE GLUCONATE 0.12 % MM MWSH
15 mL | Freq: Two times a day (BID) | ORAL | 0 refills | 23.00000 days | Status: AC
Start: 2017-12-31 — End: ?

## 2018-01-01 DIAGNOSIS — S02609G Fracture of mandible, unspecified, subsequent encounter for fracture with delayed healing: Principal | ICD-10-CM

## 2018-01-18 ENCOUNTER — Encounter: Admit: 2018-01-18 | Discharge: 2018-01-18 | Payer: BC Managed Care – PPO

## 2018-01-18 ENCOUNTER — Ambulatory Visit: Admit: 2018-01-18 | Discharge: 2018-01-19 | Payer: BC Managed Care – PPO

## 2018-01-18 DIAGNOSIS — T148XXA Other injury of unspecified body region, initial encounter: Principal | ICD-10-CM

## 2018-01-18 DIAGNOSIS — S01502D Unspecified open wound of oral cavity, subsequent encounter: Principal | ICD-10-CM

## 2018-01-18 LAB — CULTURE-FUNGAL,OTHER

## 2018-02-08 LAB — CULTURE-TB (AFB)

## 2018-02-22 ENCOUNTER — Encounter: Admit: 2018-02-22 | Discharge: 2018-02-22 | Payer: BC Managed Care – PPO

## 2018-02-22 ENCOUNTER — Ambulatory Visit: Admit: 2018-02-22 | Discharge: 2018-02-23 | Payer: BC Managed Care – PPO

## 2018-02-22 DIAGNOSIS — T148XXA Other injury of unspecified body region, initial encounter: Secondary | ICD-10-CM

## 2018-02-22 DIAGNOSIS — S02601D Fracture of unspecified part of body of right mandible, subsequent encounter for fracture with routine healing: Secondary | ICD-10-CM
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
## 2022-12-29 ENCOUNTER — Encounter: Admit: 2022-12-29 | Discharge: 2022-12-29 | Payer: BC Managed Care – PPO
